# Patient Record
Sex: Female | Born: 1980 | Race: White | Hispanic: No | Smoking: Never smoker
Health system: Southern US, Community
[De-identification: ages and names within clinical notes are randomized; demographics above are authoritative.]

## PROBLEM LIST (undated history)

## (undated) DIAGNOSIS — R002 Palpitations: Secondary | ICD-10-CM

## (undated) DIAGNOSIS — R51 Headache: Secondary | ICD-10-CM

## (undated) DIAGNOSIS — I10 Essential (primary) hypertension: Secondary | ICD-10-CM

## (undated) DIAGNOSIS — F329 Major depressive disorder, single episode, unspecified: Secondary | ICD-10-CM

## (undated) DIAGNOSIS — K219 Gastro-esophageal reflux disease without esophagitis: Secondary | ICD-10-CM

## (undated) DIAGNOSIS — F32A Depression, unspecified: Secondary | ICD-10-CM

## (undated) DIAGNOSIS — F419 Anxiety disorder, unspecified: Secondary | ICD-10-CM

## (undated) HISTORY — PX: TUBAL LIGATION: SHX77

## (undated) HISTORY — PX: EYE SURGERY: SHX253

---

## 2012-12-02 ENCOUNTER — Other Ambulatory Visit: Payer: Self-pay | Admitting: Neurosurgery

## 2012-12-02 ENCOUNTER — Other Ambulatory Visit (HOSPITAL_COMMUNITY): Payer: Self-pay | Admitting: *Deleted

## 2012-12-02 ENCOUNTER — Encounter (HOSPITAL_COMMUNITY): Payer: Self-pay | Admitting: Pharmacy Technician

## 2012-12-02 ENCOUNTER — Encounter (HOSPITAL_COMMUNITY): Payer: Self-pay | Admitting: *Deleted

## 2012-12-03 ENCOUNTER — Encounter (HOSPITAL_COMMUNITY)
Admission: RE | Disposition: A | Discharge status: Home / Self Care | Payer: Self-pay | Source: Ambulatory Visit | Attending: Neurosurgery

## 2012-12-03 ENCOUNTER — Encounter (HOSPITAL_COMMUNITY): Payer: Medicaid Other | Admitting: Certified Registered"

## 2012-12-03 ENCOUNTER — Encounter (HOSPITAL_COMMUNITY): Payer: Self-pay | Admitting: *Deleted

## 2012-12-03 ENCOUNTER — Ambulatory Visit (HOSPITAL_COMMUNITY): Payer: Medicaid Other | Admitting: Certified Registered"

## 2012-12-03 ENCOUNTER — Ambulatory Visit (HOSPITAL_COMMUNITY): Payer: Medicaid Other

## 2012-12-03 ENCOUNTER — Inpatient Hospital Stay (HOSPITAL_COMMUNITY): Payer: Medicaid Other

## 2012-12-03 ENCOUNTER — Inpatient Hospital Stay (HOSPITAL_COMMUNITY)
Admission: RE | Admit: 2012-12-03 | Discharge: 2012-12-04 | DRG: 473 | Disposition: A | Discharge status: Home / Self Care | Payer: Medicaid Other | Source: Ambulatory Visit | Attending: Neurosurgery | Admitting: Neurosurgery

## 2012-12-03 DIAGNOSIS — F329 Major depressive disorder, single episode, unspecified: Secondary | ICD-10-CM | POA: Diagnosis present

## 2012-12-03 DIAGNOSIS — F411 Generalized anxiety disorder: Secondary | ICD-10-CM | POA: Diagnosis present

## 2012-12-03 DIAGNOSIS — Z79899 Other long term (current) drug therapy: Secondary | ICD-10-CM

## 2012-12-03 DIAGNOSIS — K219 Gastro-esophageal reflux disease without esophagitis: Secondary | ICD-10-CM | POA: Diagnosis present

## 2012-12-03 DIAGNOSIS — M5020 Other cervical disc displacement, unspecified cervical region: Principal | ICD-10-CM | POA: Diagnosis present

## 2012-12-03 DIAGNOSIS — F3289 Other specified depressive episodes: Secondary | ICD-10-CM | POA: Diagnosis present

## 2012-12-03 DIAGNOSIS — I10 Essential (primary) hypertension: Secondary | ICD-10-CM | POA: Diagnosis present

## 2012-12-03 HISTORY — DX: Depression, unspecified: F32.A

## 2012-12-03 HISTORY — DX: Gastro-esophageal reflux disease without esophagitis: K21.9

## 2012-12-03 HISTORY — DX: Anxiety disorder, unspecified: F41.9

## 2012-12-03 HISTORY — DX: Palpitations: R00.2

## 2012-12-03 HISTORY — DX: Headache: R51

## 2012-12-03 HISTORY — DX: Essential (primary) hypertension: I10

## 2012-12-03 HISTORY — DX: Major depressive disorder, single episode, unspecified: F32.9

## 2012-12-03 HISTORY — PX: ANTERIOR CERVICAL DECOMP/DISCECTOMY FUSION: SHX1161

## 2012-12-03 LAB — BASIC METABOLIC PANEL
BUN: 11 mg/dL (ref 6–23)
Creatinine, Ser: 0.75 mg/dL (ref 0.50–1.10)
GFR calc Af Amer: >90 mL/min (ref >90)
GFR calc non Af Amer: >90 mL/min (ref >90)
Potassium: 4.3 mEq/L (ref 3.5–5.1)
Sodium: 135 mEq/L (ref 135–145)

## 2012-12-03 LAB — CBC
Hemoglobin: 10.9 g/dL — ABNORMAL LOW (ref 12.0–15.0)
MCHC: 32.5 g/dL (ref 30.0–36.0)
RBC: 3.74 MIL/uL — ABNORMAL LOW (ref 3.87–5.11)
RDW: 14.8 % (ref 11.5–15.5)

## 2012-12-03 LAB — SURGICAL PCR SCREEN
MRSA, PCR: NEGATIVE
Staphylococcus aureus: POSITIVE — AB

## 2012-12-03 LAB — HCG, SERUM, QUALITATIVE: Preg, Serum: NEGATIVE

## 2012-12-03 SURGERY — ANTERIOR CERVICAL DECOMPRESSION/DISCECTOMY FUSION 1 LEVEL
Anesthesia: General | Site: Spine Cervical | Wound class: Clean

## 2012-12-03 MED ORDER — MIDAZOLAM HCL 5 MG/5ML IJ SOLN
INTRAMUSCULAR | Status: DC | PRN
  Administered 2012-12-03: 2 mg via INTRAVENOUS

## 2012-12-03 MED ORDER — ACETAMINOPHEN 325 MG PO TABS: 650.0000 mg | ORAL_TABLET | ORAL | Status: DC | PRN

## 2012-12-03 MED ORDER — SODIUM CHLORIDE 0.9 % IJ SOLN
3.0000 mL | Freq: Two times a day (BID) | INTRAMUSCULAR | Status: DC
  Administered 2012-12-03: 3 mL via INTRAVENOUS

## 2012-12-03 MED ORDER — NEOSTIGMINE METHYLSULFATE 1 MG/ML IJ SOLN
INTRAMUSCULAR | Status: DC | PRN
  Administered 2012-12-03: 4 mg via INTRAVENOUS

## 2012-12-03 MED ORDER — CEFAZOLIN SODIUM-DEXTROSE 2-3 GM-% IV SOLR
INTRAVENOUS | Status: AC
  Administered 2012-12-03: 2 g via INTRAVENOUS
  Filled 2012-12-03: qty 50

## 2012-12-03 MED ORDER — SODIUM CHLORIDE 0.9 % IV SOLN
INTRAVENOUS | Status: DC
  Administered 2012-12-03: 17:00:00 via INTRAVENOUS

## 2012-12-03 MED ORDER — ESCITALOPRAM OXALATE 20 MG PO TABS
20.0000 mg | ORAL_TABLET | Freq: Every day | ORAL | Status: DC
  Filled 2012-12-03: qty 1

## 2012-12-03 MED ORDER — DEXAMETHASONE SODIUM PHOSPHATE 4 MG/ML IJ SOLN
4.0000 mg | Freq: Four times a day (QID) | INTRAMUSCULAR | Status: DC
  Administered 2012-12-03: 4 mg via INTRAVENOUS
  Filled 2012-12-03 (×5): qty 1

## 2012-12-03 MED ORDER — MUPIROCIN 2 % EX OINT
TOPICAL_OINTMENT | Freq: Two times a day (BID) | CUTANEOUS | Status: DC
  Administered 2012-12-03: 22:00:00 via NASAL

## 2012-12-03 MED ORDER — THROMBIN 5000 UNITS EX SOLR
CUTANEOUS | Status: DC | PRN
  Administered 2012-12-03 (×2): 5000 [IU] via TOPICAL

## 2012-12-03 MED ORDER — MUPIROCIN 2 % EX OINT
TOPICAL_OINTMENT | Freq: Two times a day (BID) | CUTANEOUS | Status: DC
  Administered 2012-12-03: 1 via NASAL

## 2012-12-03 MED ORDER — ROCURONIUM BROMIDE 100 MG/10ML IV SOLN
INTRAVENOUS | Status: DC | PRN
  Administered 2012-12-03: 50 mg via INTRAVENOUS

## 2012-12-03 MED ORDER — SODIUM CHLORIDE 0.9 % IJ SOLN: 3.0000 mL | INTRAMUSCULAR | Status: DC | PRN

## 2012-12-03 MED ORDER — PHENYLEPHRINE HCL 10 MG/ML IJ SOLN
INTRAMUSCULAR | Status: DC | PRN
  Administered 2012-12-03: 120 ug via INTRAVENOUS
  Administered 2012-12-03: 80 ug via INTRAVENOUS
  Administered 2012-12-03: 120 ug via INTRAVENOUS
  Administered 2012-12-03: 80 ug via INTRAVENOUS

## 2012-12-03 MED ORDER — PROPOFOL 10 MG/ML IV BOLUS
INTRAVENOUS | Status: DC | PRN
  Administered 2012-12-03: 120 mg via INTRAVENOUS

## 2012-12-03 MED ORDER — 0.9 % SODIUM CHLORIDE (POUR BTL) OPTIME
TOPICAL | Status: DC | PRN
  Administered 2012-12-03: 1000 mL

## 2012-12-03 MED ORDER — ONDANSETRON HCL 4 MG/2ML IJ SOLN: 4.0000 mg | INTRAMUSCULAR | Status: DC | PRN

## 2012-12-03 MED ORDER — GLYCOPYRROLATE 0.2 MG/ML IJ SOLN
INTRAMUSCULAR | Status: DC | PRN
  Administered 2012-12-03: .6 mg via INTRAVENOUS

## 2012-12-03 MED ORDER — OXYCODONE-ACETAMINOPHEN 5-325 MG PO TABS
1.0000 | ORAL_TABLET | ORAL | Status: DC | PRN
  Administered 2012-12-03 – 2012-12-04 (×3): 2 via ORAL
  Filled 2012-12-03 (×3): qty 2

## 2012-12-03 MED ORDER — DEXAMETHASONE SODIUM PHOSPHATE 10 MG/ML IJ SOLN
INTRAMUSCULAR | Status: AC
  Administered 2012-12-03: 10 mg via INTRAVENOUS
  Filled 2012-12-03: qty 1

## 2012-12-03 MED ORDER — IOHEXOL 300 MG/ML  SOLN
80.0000 mL | Freq: Once | INTRAMUSCULAR | Status: AC | PRN
  Administered 2012-12-03: 75 mL via INTRAVENOUS

## 2012-12-03 MED ORDER — MORPHINE SULFATE 2 MG/ML IJ SOLN: 1.0000 mg | INTRAMUSCULAR | Status: DC | PRN

## 2012-12-03 MED ORDER — DIAZEPAM 5 MG PO TABS
5.0000 mg | ORAL_TABLET | Freq: Four times a day (QID) | ORAL | Status: DC | PRN
  Administered 2012-12-03 – 2012-12-04 (×2): 5 mg via ORAL
  Filled 2012-12-03 (×2): qty 1

## 2012-12-03 MED ORDER — MUPIROCIN 2 % EX OINT
TOPICAL_OINTMENT | CUTANEOUS | Status: AC
  Filled 2012-12-03: qty 22

## 2012-12-03 MED ORDER — METOPROLOL SUCCINATE ER 25 MG PO TB24
25.0000 mg | ORAL_TABLET | Freq: Every day | ORAL | Status: DC
  Filled 2012-12-03: qty 1

## 2012-12-03 MED ORDER — MENTHOL 3 MG MT LOZG: 1.0000 | LOZENGE | OROMUCOSAL | Status: DC | PRN

## 2012-12-03 MED ORDER — HYDROMORPHONE HCL PF 1 MG/ML IJ SOLN
INTRAMUSCULAR | Status: AC
  Administered 2012-12-03: 0.5 mg via INTRAVENOUS
  Filled 2012-12-03: qty 1

## 2012-12-03 MED ORDER — HEMOSTATIC AGENTS (NO CHARGE) OPTIME
TOPICAL | Status: DC | PRN
  Administered 2012-12-03: 1 via TOPICAL

## 2012-12-03 MED ORDER — ACETAMINOPHEN 650 MG RE SUPP: 650.0000 mg | RECTAL | Status: DC | PRN

## 2012-12-03 MED ORDER — PHENOL 1.4 % MT LIQD: 1.0000 | OROMUCOSAL | Status: DC | PRN

## 2012-12-03 MED ORDER — FENTANYL CITRATE 0.05 MG/ML IJ SOLN
INTRAMUSCULAR | Status: DC | PRN
  Administered 2012-12-03: 50 ug via INTRAVENOUS
  Administered 2012-12-03 (×2): 100 ug via INTRAVENOUS

## 2012-12-03 MED ORDER — HYDROMORPHONE HCL PF 1 MG/ML IJ SOLN
0.2500 mg | INTRAMUSCULAR | Status: DC | PRN
  Administered 2012-12-03 (×2): 0.5 mg via INTRAVENOUS

## 2012-12-03 MED ORDER — LACTATED RINGERS IV SOLN
INTRAVENOUS | Status: DC
  Administered 2012-12-03: 11:00:00 via INTRAVENOUS

## 2012-12-03 MED ORDER — PHENYLEPHRINE HCL 10 MG/ML IJ SOLN
10.0000 mg | INTRAVENOUS | Status: DC | PRN
  Administered 2012-12-03: 50 ug/min via INTRAVENOUS

## 2012-12-03 MED ORDER — ONDANSETRON HCL 4 MG/2ML IJ SOLN
INTRAMUSCULAR | Status: DC | PRN
  Administered 2012-12-03: 4 mg via INTRAVENOUS

## 2012-12-03 MED ORDER — OXYCODONE HCL 5 MG/5ML PO SOLN: 5.0000 mg | Freq: Once | ORAL | Status: DC | PRN

## 2012-12-03 MED ORDER — VANCOMYCIN HCL 10 G IV SOLR
1500.0000 mg | Freq: Two times a day (BID) | INTRAVENOUS | Status: AC
  Administered 2012-12-03 – 2012-12-04 (×2): 1500 mg via INTRAVENOUS
  Filled 2012-12-03 (×2): qty 1500

## 2012-12-03 MED ORDER — OXYCODONE HCL 5 MG PO TABS: 5.0000 mg | ORAL_TABLET | Freq: Once | ORAL | Status: DC | PRN

## 2012-12-03 MED ORDER — LIDOCAINE HCL (CARDIAC) 20 MG/ML IV SOLN
INTRAVENOUS | Status: DC | PRN
  Administered 2012-12-03: 40 mg via INTRAVENOUS

## 2012-12-03 MED ORDER — DEXAMETHASONE 4 MG PO TABS
4.0000 mg | ORAL_TABLET | Freq: Four times a day (QID) | ORAL | Status: DC
  Administered 2012-12-03 – 2012-12-04 (×2): 4 mg via ORAL
  Filled 2012-12-03 (×6): qty 1

## 2012-12-03 SURGICAL SUPPLY — 55 items
BANDAGE GAUZE ELAST BULKY 4 IN (GAUZE/BANDAGES/DRESSINGS) ×4 IMPLANT
BENZOIN TINCTURE PRP APPL 2/3 (GAUZE/BANDAGES/DRESSINGS) ×2 IMPLANT
BIT DRILL SM SPINE QC 12 (BIT) ×2 IMPLANT
BLADE ULTRA TIP 2M (BLADE) ×2 IMPLANT
BUR BARREL STRAIGHT FLUTE 4.0 (BURR) ×2 IMPLANT
BUR MATCHSTICK NEURO 3.0 LAGG (BURR) ×2 IMPLANT
CANISTER SUCT 3000ML (MISCELLANEOUS) ×2 IMPLANT
CONT SPEC 4OZ CLIKSEAL STRL BL (MISCELLANEOUS) ×2 IMPLANT
COVER MAYO STAND STRL (DRAPES) ×2 IMPLANT
DRAPE C-ARM 42X72 X-RAY (DRAPES) IMPLANT
DRAPE LAPAROTOMY 100X72 PEDS (DRAPES) ×2 IMPLANT
DRAPE MICROSCOPE LEICA (MISCELLANEOUS) ×2 IMPLANT
DRAPE POUCH INSTRU U-SHP 10X18 (DRAPES) ×2 IMPLANT
DURAPREP 6ML APPLICATOR 50/CS (WOUND CARE) ×2 IMPLANT
ELECT REM PT RETURN 9FT ADLT (ELECTROSURGICAL) ×2
ELECTRODE REM PT RTRN 9FT ADLT (ELECTROSURGICAL) ×1 IMPLANT
GAUZE SPONGE 4X4 16PLY XRAY LF (GAUZE/BANDAGES/DRESSINGS) IMPLANT
GLOVE BIO SURGEON STRL SZ8 (GLOVE) ×2 IMPLANT
GLOVE BIOGEL M 8.0 STRL (GLOVE) ×2 IMPLANT
GLOVE BIOGEL PI IND STRL 7.5 (GLOVE) ×1 IMPLANT
GLOVE BIOGEL PI IND STRL 8 (GLOVE) ×2 IMPLANT
GLOVE BIOGEL PI INDICATOR 7.5 (GLOVE) ×1
GLOVE BIOGEL PI INDICATOR 8 (GLOVE) ×2
GLOVE ECLIPSE 7.5 STRL STRAW (GLOVE) ×10 IMPLANT
GLOVE EXAM NITRILE LRG STRL (GLOVE) IMPLANT
GLOVE EXAM NITRILE MD LF STRL (GLOVE) IMPLANT
GLOVE EXAM NITRILE XL STR (GLOVE) IMPLANT
GLOVE EXAM NITRILE XS STR PU (GLOVE) IMPLANT
GLOVE INDICATOR 8.5 STRL (GLOVE) ×2 IMPLANT
GOWN BRE IMP SLV AUR LG STRL (GOWN DISPOSABLE) ×2 IMPLANT
GOWN BRE IMP SLV AUR XL STRL (GOWN DISPOSABLE) ×4 IMPLANT
GOWN STRL REIN 2XL LVL4 (GOWN DISPOSABLE) ×2 IMPLANT
HEMOSTAT POWDER KIT SURGIFOAM (HEMOSTASIS) IMPLANT
KIT BASIN OR (CUSTOM PROCEDURE TRAY) ×2 IMPLANT
KIT ROOM TURNOVER OR (KITS) ×2 IMPLANT
NEEDLE SPNL 22GX3.5 QUINCKE BK (NEEDLE) ×4 IMPLANT
NS IRRIG 1000ML POUR BTL (IV SOLUTION) ×2 IMPLANT
PACK LAMINECTOMY NEURO (CUSTOM PROCEDURE TRAY) ×2 IMPLANT
PATTIES SURGICAL .5 X1 (DISPOSABLE) ×2 IMPLANT
PLATE ANT CERV XTEND 1 LV 14 (Plate) ×2 IMPLANT
PUTTY DBX 1CC (Putty) ×2 IMPLANT
PUTTY DBX 1CC DEPUY (Putty) ×1 IMPLANT
RUBBERBAND STERILE (MISCELLANEOUS) ×4 IMPLANT
SCREW XTD VAR 4.2 SELF TAP 12 (Screw) ×8 IMPLANT
SPACER COLONIAL SMALL 8MM (Spacer) ×2 IMPLANT
SPONGE GAUZE 4X4 12PLY (GAUZE/BANDAGES/DRESSINGS) ×2 IMPLANT
SPONGE INTESTINAL PEANUT (DISPOSABLE) ×2 IMPLANT
SPONGE SURGIFOAM ABS GEL SZ50 (HEMOSTASIS) ×2 IMPLANT
STRIP CLOSURE SKIN 1/2X4 (GAUZE/BANDAGES/DRESSINGS) ×2 IMPLANT
SUT VIC AB 3-0 SH 8-18 (SUTURE) ×2 IMPLANT
SYR 20ML ECCENTRIC (SYRINGE) ×2 IMPLANT
TAPE CLOTH SURG 4X10 WHT LF (GAUZE/BANDAGES/DRESSINGS) ×2 IMPLANT
TOWEL OR 17X24 6PK STRL BLUE (TOWEL DISPOSABLE) ×2 IMPLANT
TOWEL OR 17X26 10 PK STRL BLUE (TOWEL DISPOSABLE) ×2 IMPLANT
WATER STERILE IRR 1000ML POUR (IV SOLUTION) ×2 IMPLANT

## 2012-12-03 NOTE — Plan of Care (Signed)
Problem: Consults Goal: Diagnosis - Spinal Surgery Outcome: Completed/Met Date Met:  12/03/12 Cervical Spine Fusion

## 2012-12-03 NOTE — Transfer of Care (Signed)
Immediate Anesthesia Transfer of Care Note  Patient: Raven Brown  Procedure(s) Performed: Procedure(s) with comments: ANTERIOR CERVICAL DECOMPRESSION/DISCECTOMY FUSION 1 LEVEL (N/A) - C6-7 Anterior cervical decompression/diskectomy/fusion  Patient Location: PACU  Anesthesia Type:General  Level of Consciousness: awake, alert , oriented and patient cooperative  Airway & Oxygen Therapy: Patient Spontanous Breathing and Patient connected to nasal cannula oxygen  Post-op Assessment: Report given to PACU RN, Post -op Vital signs reviewed and stable and Patient moving all extremities  Post vital signs: Reviewed and stable  Complications: No apparent anesthesia complications

## 2012-12-03 NOTE — Preoperative (Addendum)
Beta Blockers   Reason not to administer Beta Blockers:Not Applicable, last dose 12/03/2012

## 2012-12-03 NOTE — Progress Notes (Signed)
Op note [phone]

## 2012-12-03 NOTE — Progress Notes (Signed)
ANTIBIOTIC CONSULT NOTE - INITIAL  Pharmacy Consult:  Vancomycin Indication:  Surgical prophylaxis  Allergies  Allergen Reactions  . Avelox [Moxifloxacin Hcl In Nacl] Anaphylaxis    Patient Measurements: Height: 5\' 6"  (167.6 cm) Weight: 182 lb 15.7 oz (83 kg) IBW/kg (Calculated) : 59.3 Vital Signs: Temp: 97.4 F (36.3 C) (10/29 1634) Temp src: Oral (10/29 0853) BP: 115/67 mmHg (10/29 1634) Pulse Rate: 102 (10/29 1644) Intake/Output from this shift: Total I/O In: 1040 [P.O.:240; I.V.:800] Out: -   Labs:  Recent Labs  12/03/12 0955  WBC 8.7  HGB 10.9*  PLT 323  CREATININE 0.75   Estimated Creatinine Clearance: 108.6 ml/min (by C-G formula based on Cr of 0.75). No results found for this basename: VANCOTROUGH, Leodis Binet, VANCORANDOM, GENTTROUGH, GENTPEAK, GENTRANDOM, TOBRATROUGH, TOBRAPEAK, TOBRARND, AMIKACINPEAK, AMIKACINTROU, AMIKACIN,  in the last 72 hours   Microbiology: Recent Results (from the past 720 hour(s))  SURGICAL PCR SCREEN     Status: Abnormal   Collection Time    12/03/12  9:54 AM      Result Value Range Status   MRSA, PCR NEGATIVE  NEGATIVE Final   Staphylococcus aureus POSITIVE (*) NEGATIVE Final   Comment:            The Xpert SA Assay (FDA     approved for NASAL specimens     in patients over 65 years of age),     is one component of     a comprehensive surveillance     program.  Test performance has     been validated by [employer] for patients greater     than or equal to 86 year old.     It is not intended     to diagnose infection nor to     guide or monitor treatment.    Medical History: Past Medical History  Diagnosis Date  . Hypertension     treated with Metoprolol  . Headache(784.0)     migraines  . Palpitations     due to Amitriptylline  . Anxiety   . Depression   . GERD (gastroesophageal reflux disease)     will take otc Prilosec occ.       Assessment: 32 YOF s/p cervical decompression/disketomy fusion to  start vancomycin for surgical prophylaxis.  Patient received Ancef pre-op but with MRSA screen being positive, vancomycin to be used post-op x 24 hours per on-call MD instruction.  Patient with good renal clearance.   Goal of Therapy:  Infection prevention   Plan:  - Vanc 1500mg  IV Q12H x 2 doses - Pharmacy will sign off as dosage adjustment unnecessary.  Thank you for the consult!    Saya Mccoll D. Laney Potash, PharmD, BCPS Pager:  [phone] 12/03/2012, 5:31 PM

## 2012-12-03 NOTE — Anesthesia Procedure Notes (Addendum)
Procedure Name: Intubation Date/Time: 12/03/2012 12:47 PM Performed by: Sharlene Dory E Pre-anesthesia Checklist: Patient identified, Emergency Drugs available, Timeout performed, Suction available and Patient being monitored Patient Re-evaluated:Patient Re-evaluated prior to inductionOxygen Delivery Method: Circle system utilized Preoxygenation: Pre-oxygenation with 100% oxygen Intubation Type: IV induction Ventilation: Mask ventilation without difficulty and Oral airway inserted - appropriate to patient size Laryngoscope Size: Mac and 3 Grade View: Grade I Tube type: Oral Tube size: 7.0 mm Number of attempts: 1 Airway Equipment and Method: Stylet and Oral airway Placement Confirmation: ETT inserted through vocal cords under direct vision,  CO2 detector,  positive ETCO2 and breath sounds checked- equal and bilateral Secured at: 23 cm Tube secured with: Tape Dental Injury: Teeth and Oropharynx as per pre-operative assessment  Comments: AOI per Guadlupe Spanish, SRNA with MDA supervision.

## 2012-12-03 NOTE — Anesthesia Preprocedure Evaluation (Addendum)
Anesthesia Evaluation  Patient identified by MRN, date of birth, ID band Patient awake    Reviewed: Allergy & Precautions, H&P , NPO status , Patient's Chart, lab work & pertinent test results, reviewed documented beta blocker date and time   Airway Mallampati: II TM Distance: >3 FB Neck ROM: Full    Dental  (+) Teeth Intact and Dental Advisory Given   Pulmonary neg pulmonary ROS,  breath sounds clear to auscultation        Cardiovascular hypertension, Pt. on medications and Pt. on home beta blockers Rhythm:Regular Rate:Normal     Neuro/Psych Anxiety Depression negative neurological ROS     GI/Hepatic Neg liver ROS, GERD-  Controlled,  Endo/Other  negative endocrine ROS  Renal/GU negative Renal ROS  negative genitourinary   Musculoskeletal negative musculoskeletal ROS (+)   Abdominal   Peds negative pediatric ROS (+)  Hematology negative hematology ROS (+)   Anesthesia Other Findings   Reproductive/Obstetrics negative OB ROS                          Anesthesia Physical Anesthesia Plan  ASA: II  Anesthesia Plan: General   Post-op Pain Management:    Induction: Intravenous  Airway Management Planned: Oral ETT  Additional Equipment:   Intra-op Plan:   Post-operative Plan: Extubation in OR  Informed Consent: I have reviewed the patients History and Physical, chart, labs and discussed the procedure including the risks, benefits and alternatives for the proposed anesthesia with the patient or authorized representative who has indicated his/her understanding and acceptance.   Dental advisory given  Plan Discussed with: CRNA  Anesthesia Plan Comments:         Anesthesia Quick Evaluation

## 2012-12-03 NOTE — Anesthesia Postprocedure Evaluation (Signed)
  Anesthesia Post-op Note  Patient: Raven Brown  Procedure(s) Performed: Procedure(s): CERVICAL SIX-SEVEN ANTERIOR CERVICAL DECOMPRESSION/DISKECTOMY FUSION (N/A)  Patient Location: PACU  Anesthesia Type:General  Level of Consciousness: awake and alert   Airway and Oxygen Therapy: Patient Spontanous Breathing  Post-op Pain: mild  Post-op Assessment: Post-op Vital signs reviewed, Patient's Cardiovascular Status Stable and Respiratory Function Stable  Post-op Vital Signs: Reviewed  Filed Vitals:   12/03/12 1400  BP:   Pulse:   Temp: 36.6 C  Resp:     Complications: No apparent anesthesia complications

## 2012-12-03 NOTE — Progress Notes (Signed)
Orthopedic Tech Progress Note Patient Details:  Raven Brown 1980/02/24 [id number]  Ortho Devices Type of Ortho Device: Soft collar Ortho Device/Splint Location: neck Ortho Device/Splint Interventions: Ordered;Application   Jennye Moccasin 12/03/2012, 4:46 PM

## 2012-12-03 NOTE — H&P (Signed)
Raven Brown is an 32 y.o. female.   Chief Complaint: right armpainHPI: seen in my office 2 days ago with sudden onset of neck pain with radiation to thre right arm, no better with conservative treatment. Mri shows a large hnp at cervical 6-7  Past Medical History  Diagnosis Date  . Hypertension     treated with Metoprolol  . Headache(784.0)     migraines  . Palpitations     due to Amitriptylline  . Anxiety   . Depression   . GERD (gastroesophageal reflux disease)     will take otc Prilosec occ.    Past Surgical History  Procedure Laterality Date  . Cesarean section      x2  . Tubal ligation    . Eye surgery      lasik on both eyes    Family History  Problem Relation Age of Onset  . Supraventricular tachycardia Mother   . Hypertension Father   . Diabetes Father    Social History:  reports that she has never smoked. She has never used smokeless tobacco. She reports that she drinks alcohol. She reports that she does not use illicit drugs.  Allergies:  Allergies  Allergen Reactions  . Avelox [Moxifloxacin Hcl In Nacl] Anaphylaxis    Medications Prior to Admission  Medication Sig Dispense Refill  . diazepam (VALIUM) 5 MG tablet Take 5 mg by mouth every 6 (six) hours as needed (muscle spasms).      Marland Kitchen escitalopram (LEXAPRO) 20 MG tablet Take 20 mg by mouth daily.      . metoprolol succinate (TOPROL-XL) 25 MG 24 hr tablet Take 25 mg by mouth daily.      Marland Kitchen oxyCODONE (ROXICODONE) 15 MG immediate release tablet Take 15 mg by mouth every 4 (four) hours as needed for pain.        Results for orders placed during the hospital encounter of 12/03/12 (from the past 48 hour(s))  SURGICAL PCR SCREEN     Status: Abnormal   Collection Time    12/03/12  9:54 AM      Result Value Range   MRSA, PCR NEGATIVE  NEGATIVE   Staphylococcus aureus POSITIVE (*) NEGATIVE   Comment:            The Xpert SA Assay (FDA     approved for NASAL specimens     in patients over 15 years of age),      is one component of     a comprehensive surveillance     program.  Test performance has     been validated by [employer] for patients greater     than or equal to 7 year old.     It is not intended     to diagnose infection nor to     guide or monitor treatment.  BASIC METABOLIC PANEL     Status: Abnormal   Collection Time    12/03/12  9:55 AM      Result Value Range   Sodium 135  135 - 145 mEq/L   Potassium 4.3  3.5 - 5.1 mEq/L   Chloride 98  96 - 112 mEq/L   CO2 28  19 - 32 mEq/L   Glucose, Bld 104 (*) 70 - 99 mg/dL   BUN 11  6 - 23 mg/dL   Creatinine, Ser 6.96  0.50 - 1.10 mg/dL   Calcium 9.6  8.4 - 29.5 mg/dL   GFR calc non Af  Amer >90  >90 mL/min   GFR calc Af Amer >90  >90 mL/min   Comment: (NOTE)     The eGFR has been calculated using the CKD EPI equation.     This calculation has not been validated in all clinical situations.     eGFR's persistently <90 mL/min signify possible Chronic Kidney     Disease.  CBC     Status: Abnormal   Collection Time    12/03/12  9:55 AM      Result Value Range   WBC 8.7  4.0 - 10.5 K/uL   RBC 3.74 (*) 3.87 - 5.11 MIL/uL   Hemoglobin 10.9 (*) 12.0 - 15.0 g/dL   HCT 16.1 (*) 09.6 - 04.5 %   MCV 89.6  78.0 - 100.0 fL   MCH 29.1  26.0 - 34.0 pg   MCHC 32.5  30.0 - 36.0 g/dL   RDW 40.9  81.1 - 91.4 %   Platelets 323  150 - 400 K/uL  HCG, SERUM, QUALITATIVE     Status: None   Collection Time    12/03/12  9:55 AM      Result Value Range   Preg, Serum NEGATIVE  NEGATIVE   Comment:            THE SENSITIVITY OF THIS     METHODOLOGY IS >10 mIU/mL.   Dg Chest 2 View  12/03/2012   CLINICAL DATA:  Hypertension, preoperative evaluation  EXAM: CHEST  2 VIEW  COMPARISON:  11/15/2012 right shoulder series  FINDINGS: Normal heart size and vascularity. Faint ill-defined round opacity in the right upper lung, roughly measures 2.5 x 2.0 cm, and projects over the 3rd anterior rib and the 6th posterior rib shadows. Focal airspace  process/pneumonia versus underlying nodule not excluded. Minor central bronchitic change and left basilar streaky opacity compatible with atelectasis. No effusion or pneumothorax. Trachea is midline. No osseous abnormality.  IMPRESSION: Right upper lung 2.5 cm round focal airspace process/pneumonia versus lung nodule. Consider short-term followup in 1 month versus nonemergent chest CT.  Mild central bronchitic change and left base atelectasis  These results will be called to the ordering clinician or representative by the Radiologist Assistant, and communication documented in the PACS Dashboard.   Electronically Signed   By: Ruel Favors M.D.   On: 12/03/2012 10:16    Review of Systems  Constitutional: Negative.   Eyes: Negative.   Respiratory: Negative.   Cardiovascular: Negative.   Gastrointestinal: Negative.   Genitourinary: Negative.   Musculoskeletal: Positive for neck pain.  Skin: Negative.   Neurological: Positive for sensory change and focal weakness.  Endo/Heme/Allergies: Negative.   Psychiatric/Behavioral: Positive for depression. The patient is nervous/anxious.     Blood pressure 128/76, pulse 104, temperature 98.8 F (37.1 C), temperature source Oral, resp. rate 20, height 5\' 6"  (1.676 m), weight 83 kg (182 lb 15.7 oz), last menstrual period 11/24/2012, SpO2 96.00%. Physical Exam crying because pain. Hent, nl. Neck, no movement to prevent pain. Cv, nl. lungds clear/. Abdomen, nl. Extremities, nl. Neuro weakness of right triceps. Sensory changes along c7 dermatome.  Assessment/Plan Decompression and fusion at cervical 6-7. Patient and mother are aware of risks and benefits.  Satin Boal M 12/03/2012, 11:46 AM

## 2012-12-04 NOTE — Discharge Summary (Signed)
Physician Discharge Summary  Patient ID: Sue Fernicola MRN: [id number] DOB/AGE: [birth year] 32 y.o.  Admit date: 12/03/2012 Discharge date: 12/04/2012  Admission Diagnoses:c6-7 hnp  Discharge Diagnoses:same  Active Problems:   * No active hospital problems. *   Discharged Condition: no pain  Hospital Course: surgery  Consults: none  Significant Diagnostic Studies:mri cervical. Ct chest  Treatments: fusion at cervical 6-7 Discharge Exam: Blood pressure 122/78, pulse 97, temperature 98.8 F (37.1 C), temperature source Oral, resp. rate 16, height 5\' 6"  (1.676 m), weight 83 kg (182 lb 15.7 oz), last menstrual period 11/24/2012, SpO2 95.00%. No pain, no weakness  Disposition:home    Medication List    ASK your doctor about these medications       diazepam 5 MG tablet  Commonly known as:  VALIUM  Take 5 mg by mouth every 6 (six) hours as needed (muscle spasms).     escitalopram 20 MG tablet  Commonly known as:  LEXAPRO  Take 20 mg by mouth daily.     metoprolol succinate 25 MG 24 hr tablet  Commonly known as:  TOPROL-XL  Take 25 mg by mouth daily.     oxyCODONE 15 MG immediate release tablet  Commonly known as:  ROXICODONE  Take 15 mg by mouth every 4 (four) hours as needed for pain.         Signed: Karn Cassis 12/04/2012, 9:32 AM

## 2012-12-04 NOTE — Op Note (Signed)
Raven Brown, HAUGHTON NO.:  [id number]  MEDICAL RECORD NO.:  [id number]  LOCATION:  3C02C                        FACILITY:  [facility]  PHYSICIAN:  Hilda Lias, M.D.   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  POSTOPERATIVE DIAGNOSIS:  Acute right C6-7 herniated disk with right side radiculopathy.  PROCEDURE:  Anterior C6-C7 diskectomy, decompression of the spinal cord, decompression of both C7 nerve roots.  Removal of several fragment of disk in the right side, interbody fusion with a cage 8 mm height, lordotic with autograft and DBX, plate, microscope.  SURGEON:  Hilda Lias, M.D.  ASSISTANT:  Donalee Citrin, M.D.  CLINICAL HISTORY:  The patient was seen by me 2 days ago in my office with pain going to the right upper extremity associated with severe weakness of the right triceps.  X-ray showed large herniated disk at the level C6-7 with displacement of the spinal cord.  She had been taking medication for 2 weeks with no improvement.  Surgery was advised and the risks were fully explained to her and her mother.  PROCEDURE:  The patient was taken to the OR, and after intubation, the left side of the neck was cleaned with DuraPrep.  Transverse incision was made through the skin, subcutaneous tissue, platysma straight down to the cervical spine.  The patient has a short neck, we used 2 needles, the upper needle was at the level of 5-6.  From then on, we localized C6- 7 with the help of the microscope, we opened the anterior ligament. Indeed what we found was the disk space was almost end.  Removal of the endplate was done and we went straight down to the posterior ligament. There was some opening with fragment of disk going from the mid-level of the spinal cord straight to the right side.  At  least 7 fragment were removed and at the end we had good decompression not only of the spinal cord but the C6-C7 space and the nerve root.  Then, we introduced a cage, 8 mm height, lordotic with DBX and autograft inside.  This was followed by a plate using 4 screws.  X-ray showed the upper part of the plate.  From then on, I waited 5 minutes and once we achieved hemostasis, the wound was closed with Vicryl and Steri-Strips.          ______________________________ Hilda Lias, M.D.     EB/MEDQ  D:  12/03/2012  T:  12/04/2012  Job:  [id number]

## 2012-12-04 NOTE — Progress Notes (Signed)
Pt given D/C instructions with verbal understanding. Pt D/C'd home via walking per MD order @ 1005. Rema Fendt, RN

## 2012-12-05 ENCOUNTER — Encounter (HOSPITAL_COMMUNITY): Payer: Self-pay | Admitting: Neurosurgery

## 2012-12-09 ENCOUNTER — Other Ambulatory Visit (HOSPITAL_COMMUNITY): Payer: Self-pay | Admitting: Neurosurgery

## 2012-12-09 DIAGNOSIS — R911 Solitary pulmonary nodule: Secondary | ICD-10-CM

## 2012-12-15 ENCOUNTER — Ambulatory Visit (HOSPITAL_COMMUNITY): Admission: RE | Admit: 2012-12-15 | Payer: Medicaid Other | Source: Ambulatory Visit

## 2013-11-02 ENCOUNTER — Encounter (HOSPITAL_COMMUNITY): Payer: Self-pay | Admitting: Emergency Medicine

## 2013-11-02 ENCOUNTER — Emergency Department (HOSPITAL_COMMUNITY)
Admission: EM | Admit: 2013-11-02 | Discharge: 2013-11-03 | Disposition: A | Discharge status: Home / Self Care | Payer: Medicaid Other | Attending: Emergency Medicine | Admitting: Emergency Medicine

## 2013-11-02 DIAGNOSIS — F32A Depression, unspecified: Secondary | ICD-10-CM

## 2013-11-02 DIAGNOSIS — R079 Chest pain, unspecified: Secondary | ICD-10-CM | POA: Diagnosis not present

## 2013-11-02 DIAGNOSIS — I10 Essential (primary) hypertension: Secondary | ICD-10-CM | POA: Diagnosis not present

## 2013-11-02 DIAGNOSIS — G43909 Migraine, unspecified, not intractable, without status migrainosus: Secondary | ICD-10-CM | POA: Diagnosis not present

## 2013-11-02 DIAGNOSIS — F4321 Adjustment disorder with depressed mood: Secondary | ICD-10-CM | POA: Diagnosis not present

## 2013-11-02 DIAGNOSIS — F3289 Other specified depressive episodes: Secondary | ICD-10-CM | POA: Insufficient documentation

## 2013-11-02 DIAGNOSIS — F329 Major depressive disorder, single episode, unspecified: Secondary | ICD-10-CM | POA: Diagnosis present

## 2013-11-02 DIAGNOSIS — Z79899 Other long term (current) drug therapy: Secondary | ICD-10-CM | POA: Diagnosis not present

## 2013-11-02 DIAGNOSIS — K219 Gastro-esophageal reflux disease without esophagitis: Secondary | ICD-10-CM | POA: Diagnosis not present

## 2013-11-02 DIAGNOSIS — Z3202 Encounter for pregnancy test, result negative: Secondary | ICD-10-CM | POA: Insufficient documentation

## 2013-11-02 DIAGNOSIS — F411 Generalized anxiety disorder: Secondary | ICD-10-CM | POA: Diagnosis not present

## 2013-11-02 LAB — COMPREHENSIVE METABOLIC PANEL
ALBUMIN: 4.3 g/dL (ref 3.5–5.2)
ALT: 11 U/L (ref 0–35)
AST: 16 U/L (ref 0–37)
Alkaline Phosphatase: 80 U/L (ref 39–117)
Anion gap: 11 (ref 5–15)
BUN: 9 mg/dL (ref 6–23)
CALCIUM: 10.1 mg/dL (ref 8.4–10.5)
CO2: 24 mEq/L (ref 19–32)
CREATININE: 0.71 mg/dL (ref 0.50–1.10)
Chloride: 102 mEq/L (ref 96–112)
GFR calc Af Amer: >90 mL/min (ref >90)
GFR calc non Af Amer: >90 mL/min (ref >90)
Glucose, Bld: 98 mg/dL (ref 70–99)
Potassium: 5 mEq/L (ref 3.7–5.3)
SODIUM: 137 meq/L (ref 137–147)
TOTAL PROTEIN: 7.6 g/dL (ref 6.0–8.3)
Total Bilirubin: <0.2 mg/dL — ABNORMAL LOW (ref 0.3–1.2)

## 2013-11-02 LAB — CBC
HCT: 38.8 % (ref 36.0–46.0)
Hemoglobin: 13 g/dL (ref 12.0–15.0)
MCH: 29.6 pg (ref 26.0–34.0)
MCHC: 33.5 g/dL (ref 30.0–36.0)
MCV: 88.4 fL (ref 78.0–100.0)
PLATELETS: 356 10*3/uL (ref 150–400)
RBC: 4.39 MIL/uL (ref 3.87–5.11)
RDW: 12.4 % (ref 11.5–15.5)
WBC: 10.5 10*3/uL (ref 4.0–10.5)

## 2013-11-02 LAB — RAPID URINE DRUG SCREEN, HOSP PERFORMED
AMPHETAMINES: NOT DETECTED
BENZODIAZEPINES: POSITIVE — AB
Barbiturates: NOT DETECTED
Cocaine: NOT DETECTED
OPIATES: NOT DETECTED
Tetrahydrocannabinol: NOT DETECTED

## 2013-11-02 LAB — ACETAMINOPHEN LEVEL: Acetaminophen (Tylenol), Serum: <15 ug/mL (ref 10–30)

## 2013-11-02 LAB — ETHANOL: Alcohol, Ethyl (B): <11 mg/dL (ref 0–11)

## 2013-11-02 LAB — POC URINE PREG, ED: PREG TEST UR: NEGATIVE

## 2013-11-02 LAB — SALICYLATE LEVEL

## 2013-11-02 MED ORDER — ZOLPIDEM TARTRATE 5 MG PO TABS: 5.0000 mg | ORAL_TABLET | Freq: Every evening | ORAL | Status: DC | PRN

## 2013-11-02 MED ORDER — IBUPROFEN 200 MG PO TABS: 600.0000 mg | ORAL_TABLET | Freq: Three times a day (TID) | ORAL | Status: DC | PRN

## 2013-11-02 MED ORDER — ACETAMINOPHEN 325 MG PO TABS: 650.0000 mg | ORAL_TABLET | ORAL | Status: DC | PRN

## 2013-11-02 MED ORDER — ONDANSETRON HCL 4 MG PO TABS: 4.0000 mg | ORAL_TABLET | Freq: Three times a day (TID) | ORAL | Status: DC | PRN

## 2013-11-02 MED ORDER — NICOTINE 21 MG/24HR TD PT24: 21.0000 mg | MEDICATED_PATCH | Freq: Every day | TRANSDERMAL | Status: DC

## 2013-11-02 MED ORDER — ALUM & MAG HYDROXIDE-SIMETH 200-200-20 MG/5ML PO SUSP: 30.0000 mL | ORAL | Status: DC | PRN

## 2013-11-02 MED ORDER — LORAZEPAM 1 MG PO TABS
1.0000 mg | ORAL_TABLET | Freq: Three times a day (TID) | ORAL | Status: DC | PRN
  Administered 2013-11-02: 1 mg via ORAL
  Filled 2013-11-02: qty 1

## 2013-11-02 NOTE — ED Provider Notes (Signed)
CSN: [id number]     Arrival date & time 11/02/13  1932 History   First MD Initiated Contact with Patient 11/02/13 2125     Chief Complaint  Patient presents with  . Depression   The history is provided by the patient and a parent. No language interpreter was used.   This chart was scribed for non-physician practitioner, Junious Silk PA-C working with Mirian Mo, MD, by Andrew Au, ED Scribe. This patient was seen in room WTR4/WLPT4 and the patient's care was started at 10:04 PM.  Raven Brown is a 33 y.o. female who presents to the Emergency Department complaining of worsening depression. Pt states she was at [facility] for 3 night in July. Pt states she has not had consistency with medication for the past 2 months. Per family member, pt has a lot of anger. Pt reports it has come to a point where she has a lot of anger built up and is reaching out for help. Pt states she has CP from anxiety. Pt reports hx of though of hurting herself. She denies specific suicidal ideation, but states she has a bottle of Xanax and wouldn't think twice about taking the whole thing at once to "go to sleep". Pt denies hallucination. Pt denies alcohol and drugs.  Pt denies nausea and emesis.   Past Medical History  Diagnosis Date  . Hypertension     treated with Metoprolol  . Headache(784.0)     migraines  . Palpitations     due to Amitriptylline  . Anxiety   . Depression   . GERD (gastroesophageal reflux disease)     will take otc Prilosec occ.   Past Surgical History  Procedure Laterality Date  . Cesarean section      x2  . Tubal ligation    . Eye surgery      lasik on both eyes  . Anterior cervical decomp/discectomy fusion N/A 12/03/2012    Procedure: CERVICAL SIX-SEVEN ANTERIOR CERVICAL DECOMPRESSION/DISKECTOMY FUSION;  Surgeon: Karn Cassis, MD;  Location: [facility];  Service: Neurosurgery;  Laterality: N/A;   Family History  Problem Relation Age of Onset  . Supraventricular  tachycardia Mother   . Hypertension Father   . Diabetes Father    History  Substance Use Topics  . Smoking status: Never Smoker   . Smokeless tobacco: Never Used  . Alcohol Use: Yes     Comment: socially   OB History   Grav Para Term Preterm Abortions TAB SAB Ect Mult Living                 Review of Systems  Respiratory: Negative for shortness of breath.   Cardiovascular: Positive for chest pain.  Gastrointestinal: Negative for nausea and vomiting.  Psychiatric/Behavioral: Positive for dysphoric mood. Negative for suicidal ideas. The patient is nervous/anxious.   All other systems reviewed and are negative.   Allergies  Avelox  Home Medications   Prior to Admission medications   Medication Sig Start Date End Date Taking? Authorizing Provider  diazepam (VALIUM) 5 MG tablet Take 5 mg by mouth every 6 (six) hours as needed (muscle spasms).    Historical Provider, MD  escitalopram (LEXAPRO) 20 MG tablet Take 20 mg by mouth daily.    Historical Provider, MD  metoprolol succinate (TOPROL-XL) 25 MG 24 hr tablet Take 25 mg by mouth daily.    Historical Provider, MD  oxyCODONE (ROXICODONE) 15 MG immediate release tablet Take 15 mg by mouth every 4 (four) hours  as needed for pain.    Historical Provider, MD   BP 143/85  Pulse 71  Temp(Src) 98.2 F (36.8 C) (Oral)  Resp 16  SpO2 100% Physical Exam  Nursing note and vitals reviewed. Constitutional: She is oriented to person, place, and time. She appears well-developed and well-nourished. She does not appear ill. No distress.  HENT:  Head: Normocephalic and atraumatic.  Right Ear: External ear normal.  Left Ear: External ear normal.  Nose: Nose normal.  Mouth/Throat: Oropharynx is clear and moist.  Eyes: Conjunctivae are normal.  Neck: Normal range of motion.  Cardiovascular: Normal rate, regular rhythm and normal heart sounds.   Pulmonary/Chest: Effort normal and breath sounds normal. No stridor. No respiratory distress.  She has no wheezes. She has no rales.  Abdominal: Soft. She exhibits no distension.  Musculoskeletal: Normal range of motion.  Neurological: She is alert and oriented to person, place, and time. She has normal strength.  Skin: Skin is warm and dry. She is not diaphoretic. No erythema.  Psychiatric: Her behavior is normal. She exhibits a depressed mood. She expresses no homicidal and no suicidal ideation. She expresses no suicidal plans and no homicidal plans.    ED Course  Procedures (including critical care time) DIAGNOSTIC STUDIES: Oxygen Saturation is 100% on Ra, normal by my interpretation.    COORDINATION OF CARE: 10:03 PM- Pt advised of plan for treatment and pt agrees.  Labs Review Labs Reviewed  COMPREHENSIVE METABOLIC PANEL - Abnormal; Notable for the following:    Total Bilirubin <0.2 (*)    All other components within normal limits  SALICYLATE LEVEL - Abnormal; Notable for the following:    Salicylate Lvl <2.0 (*)    All other components within normal limits  URINE RAPID DRUG SCREEN (HOSP PERFORMED) - Abnormal; Notable for the following:    Benzodiazepines POSITIVE (*)    All other components within normal limits  ACETAMINOPHEN LEVEL  CBC  ETHANOL  POC URINE PREG, ED    Imaging Review No results found.   EKG Interpretation None      MDM   Final diagnoses:  Depression   Patient presents to ED for evaluation of depression. She has not taken any of her antidepressants. She has vague suicidal ideations. Will have psychiatry consult with patient. Vital signs stable. Patient medically cleared. Patient / Family / Caregiver informed of clinical course, understand medical decision-making process, and agree with plan.   I personally performed the services described in this documentation, which was scribed in my presence. The recorded information has been reviewed and is accurate.     Mora Bellman, PA-C 11/03/13 [phone]

## 2013-11-02 NOTE — ED Notes (Addendum)
Pt reports severe depression, anger issues, and anxiety.  Pt reports being seen at [facility] and  But states that her meds needs to be regulated.  Pt reports increasing depression.  Pt denies SI/HI but states she did not care if she took an overdose.

## 2013-11-02 NOTE — ED Notes (Signed)
Bed: WBH42 Expected date:  Expected time:  Means of arrival:  Comments: Hold for triage 4 

## 2013-11-03 MED ORDER — HALOPERIDOL 5 MG PO TABS
5.0000 mg | ORAL_TABLET | Freq: Three times a day (TID) | ORAL | Status: DC | PRN
  Administered 2013-11-03: 5 mg via ORAL
  Filled 2013-11-03: qty 1

## 2013-11-03 MED ORDER — LORAZEPAM 1 MG PO TABS
2.0000 mg | ORAL_TABLET | Freq: Four times a day (QID) | ORAL | Status: DC | PRN
  Administered 2013-11-03: 2 mg via ORAL
  Filled 2013-11-03: qty 2

## 2013-11-03 NOTE — Discharge Instructions (Signed)

## 2013-11-03 NOTE — ED Notes (Signed)
Patient complains anxiety. Patient appears sad and is tearful at this time. Patient states that her [facility] Patient will be medicated per MAR. Encouragement and support provided and safety maintain.

## 2013-11-03 NOTE — ED Provider Notes (Signed)
Medical screening examination/treatment/procedure(s) were performed by non-physician practitioner and as supervising physician I was immediately available for consultation/collaboration.   EKG Interpretation None      Patient was seen and evaluated by psychiatry.  They do not believe she is a threat to herself or to others.  Outpatient resources have been given.  Patient is agreeable to outpatient plan per TTS team  Lyanne CoKevin M Keshun Berrett, MD 11/03/13 [phone]

## 2013-11-03 NOTE — ED Notes (Signed)
Patient complains of increase anxiety. Patient states " I feel like I'm going to jump out of my skin". Donell SievertSpencer Simon, PA contacted and new orders received and and read back. Encouragement and support provided and safety maintain.

## 2015-10-23 IMAGING — CR DG CHEST 2V
2 series · 2 of 2 positions shown · non-contrast
Comparison: 11/15/2012 right shoulder series

CLINICAL DATA: Hypertension, preoperative evaluation

EXAM:
CHEST  2 VIEW

[w chest pa]
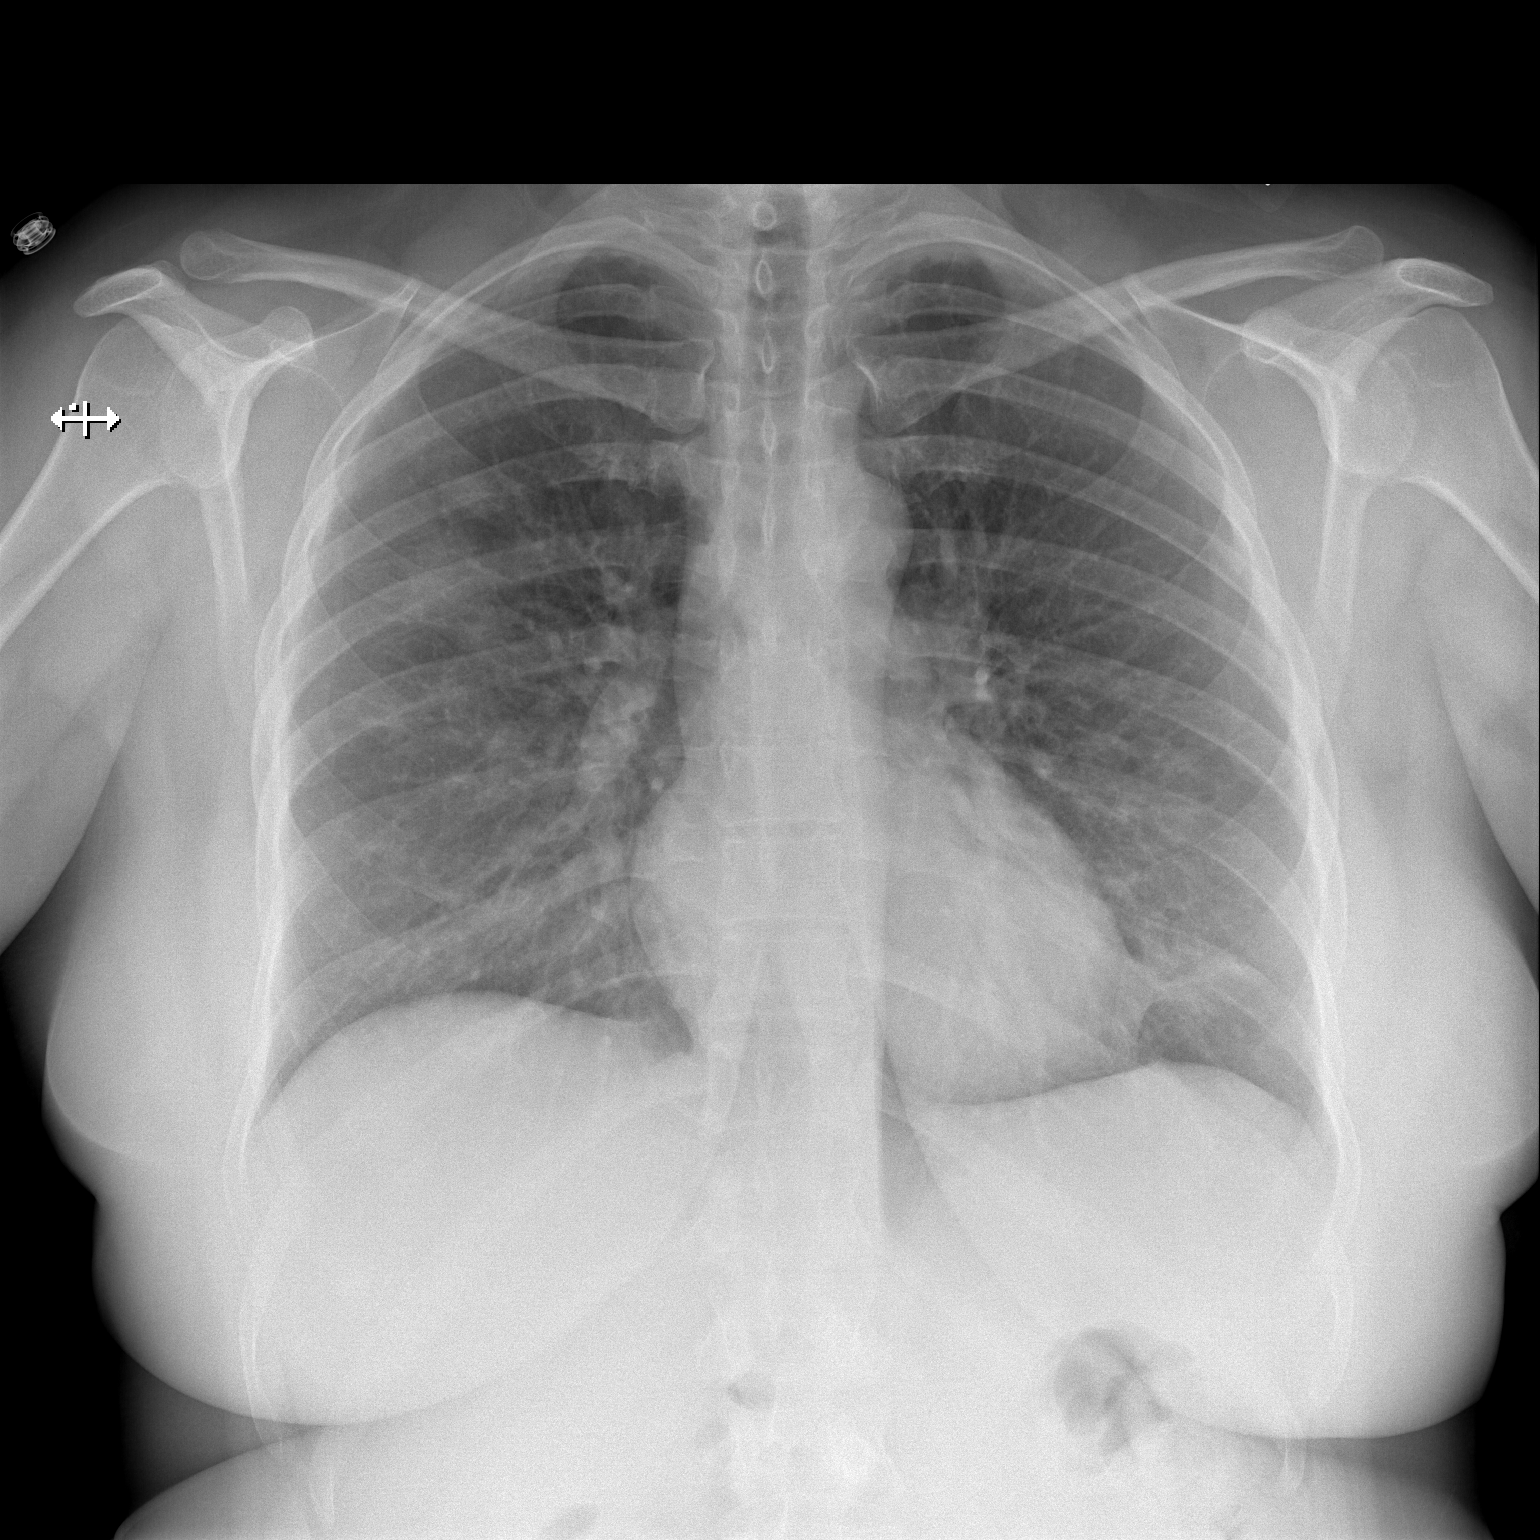

[w chest lat]
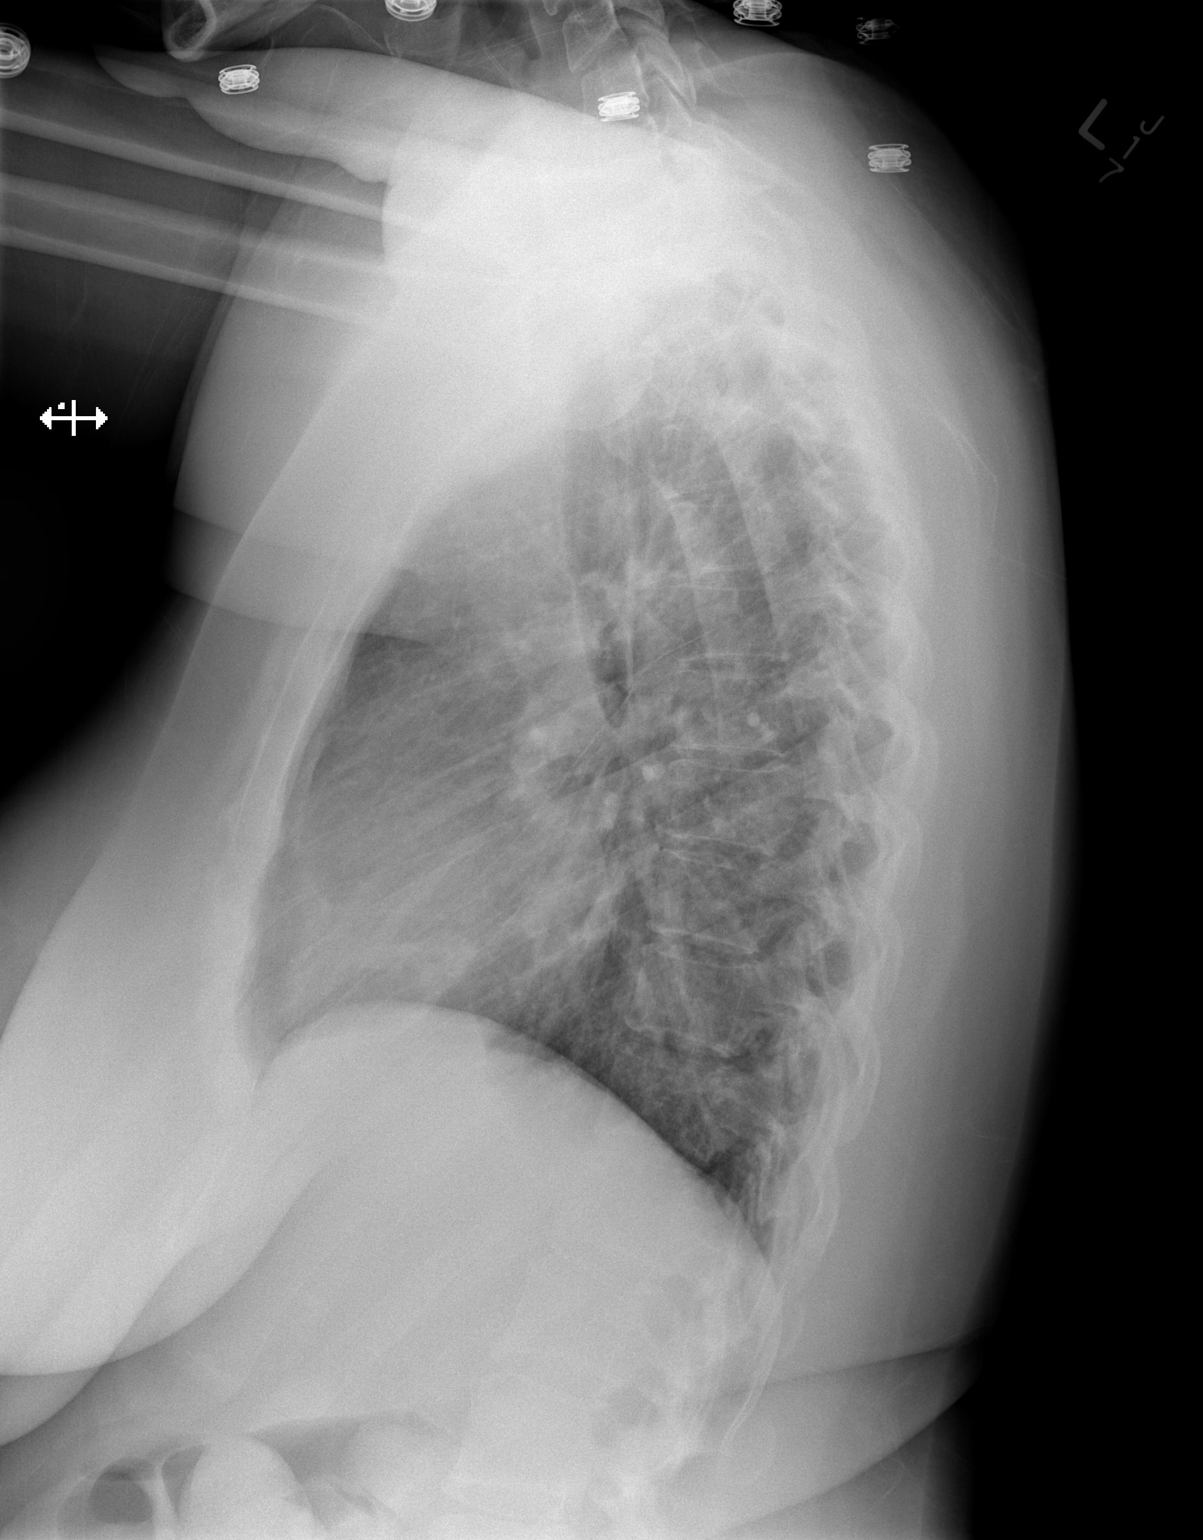

[2 of 2 positions shown; findings below may reference images not displayed]

FINDINGS: Normal heart size and vascularity. Faint ill-defined round opacity
in the right upper lung, roughly measures 2.5 x 2.0 cm, and projects
over the 3rd anterior rib and the 6th posterior rib shadows. Focal
airspace process/pneumonia versus underlying nodule not excluded.
Minor central bronchitic change and left basilar streaky opacity
compatible with atelectasis. No effusion or pneumothorax. Trachea is
midline. No osseous abnormality.
IMPRESSION: Right upper lung 2.5 cm round focal airspace process/pneumonia
versus lung nodule. Consider short-term followup in 1 month versus
nonemergent chest CT.

Mild central bronchitic change and left base atelectasis

These results will be called to the ordering clinician or
representative by the Radiologist Assistant, and communication
documented in the PACS Dashboard.

## 2015-10-23 IMAGING — CT CT CHEST W/ CM
2 of 3 series · 15 of 36 positions shown, 18 images · IV contrast (omnipaque)
Comparison: Chest x-ray from earlier the same

CLINICAL DATA: Mass in the right lung

EXAM:
CT CHEST WITH CONTRAST
TECHNIQUE: Multidetector CT imaging of the chest was performed during
intravenous contrast administration.
CONTRAST:  75mL OMNIPAQUE IOHEXOL 300 MG/ML  SOLN

[Series 2: thorax 5.0 i31f 1 · axial · 0.74mm/px · z∈[+1093,+1333]mm · 12 of 58 slices shown, 15 images]
[im 5/58  mediastinal]
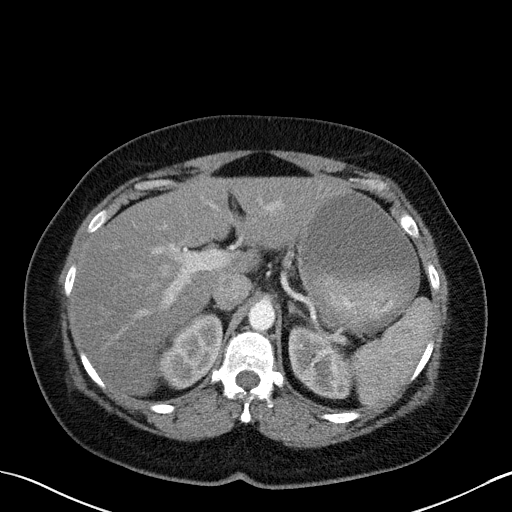
[im 5/58  lung]
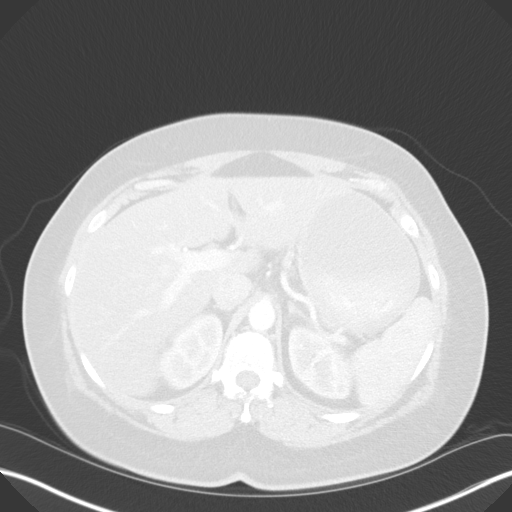
[im 9/58  lung]
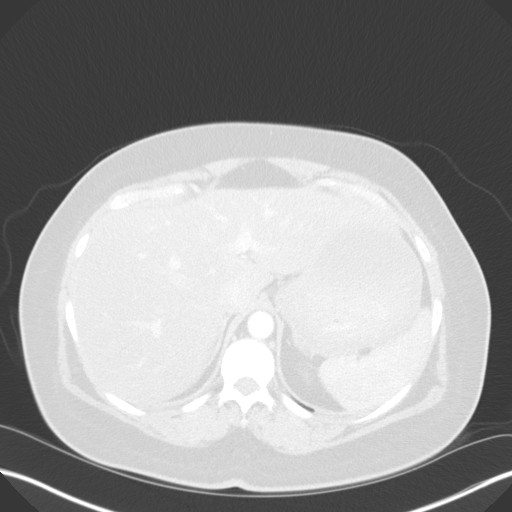
[im 13/58  lung]
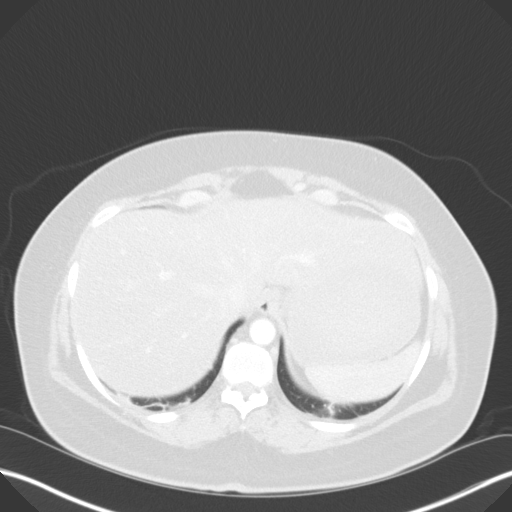
[im 17/58  lung]
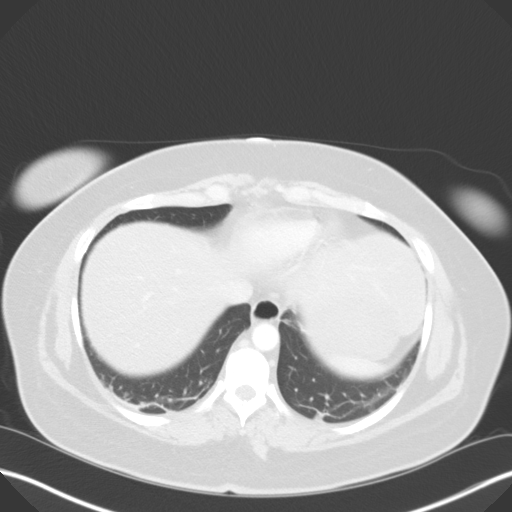
[im 22/58  mediastinal]
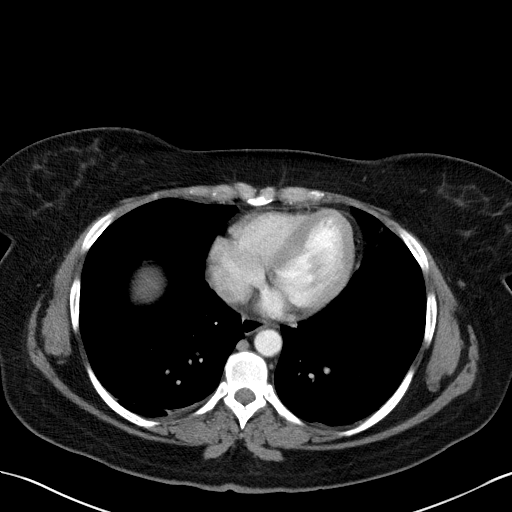
[im 22/58  lung]
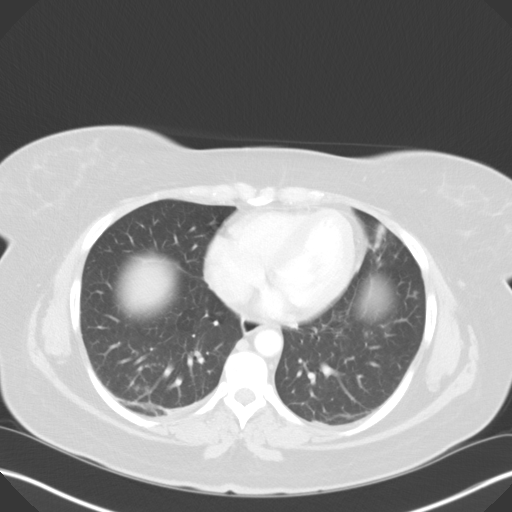
[im 26/58  lung]
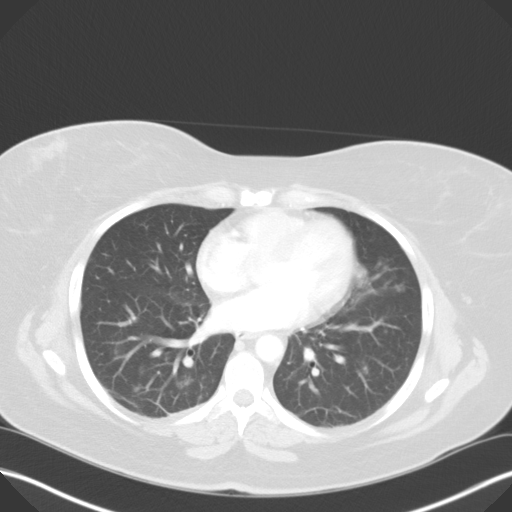
[im 32/58  lung]
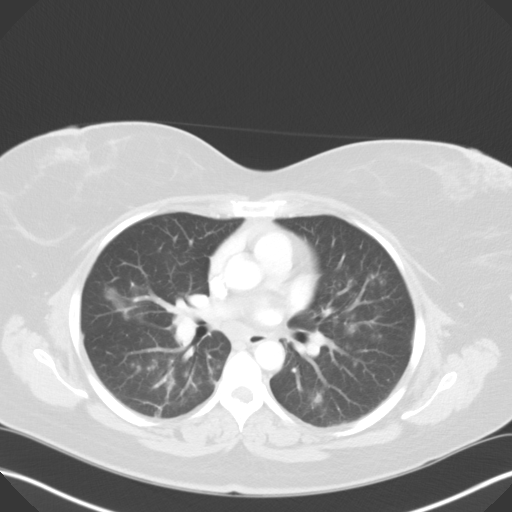
[im 36/58  lung]
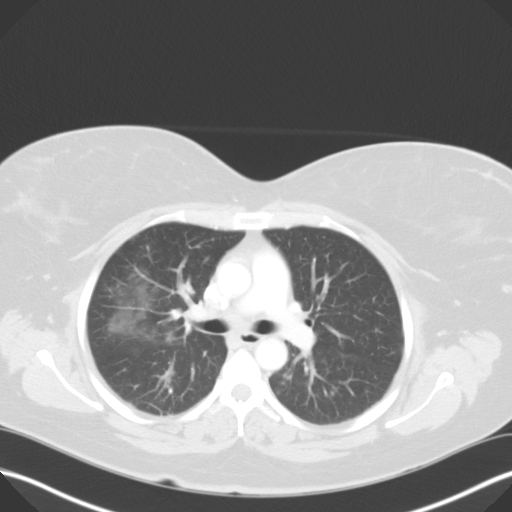
[im 41/58  mediastinal]
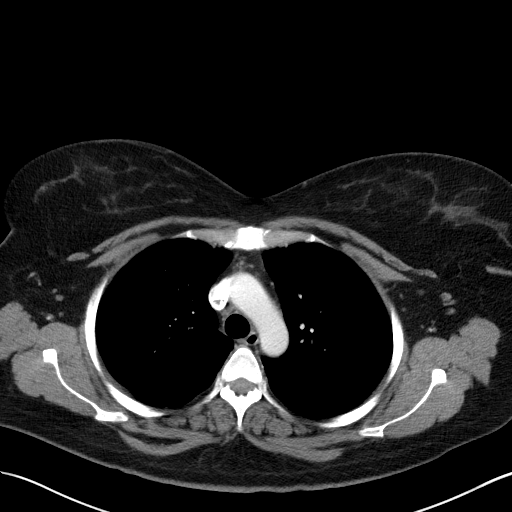
[im 41/58  lung]
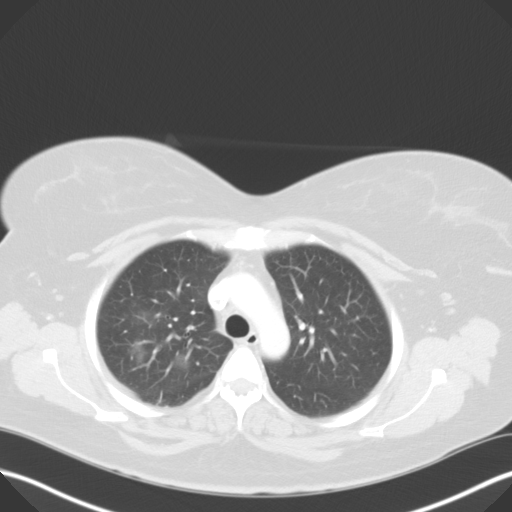
[im 45/58  lung]
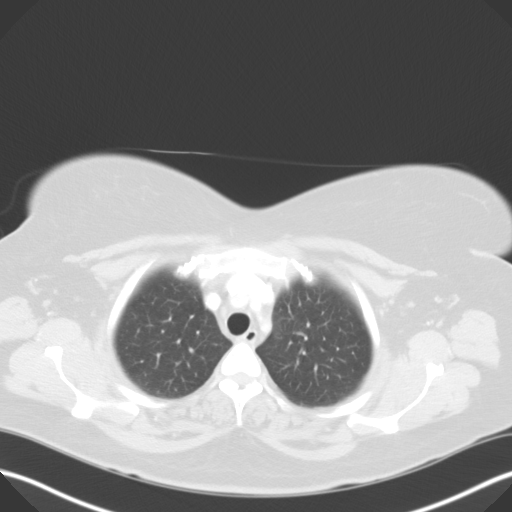
[im 49/58  lung]
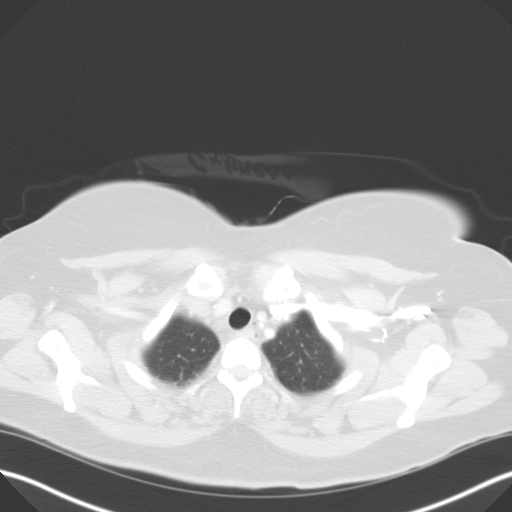
[im 53/58  lung]
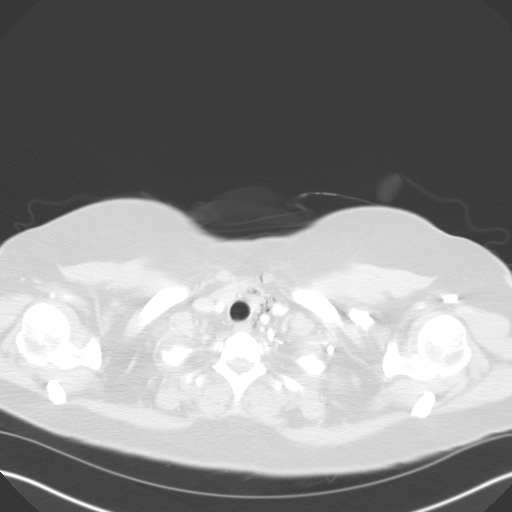

[Series 5: coronal · coronal · 0.59mm/px · 3 of 74 slices shown]
[im 15/74  lung]
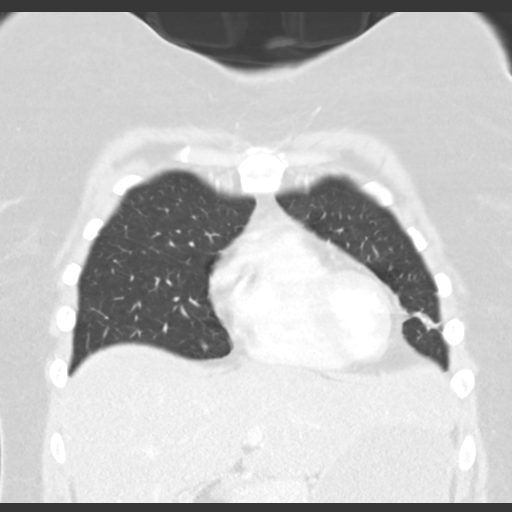
[im 30/74  lung]
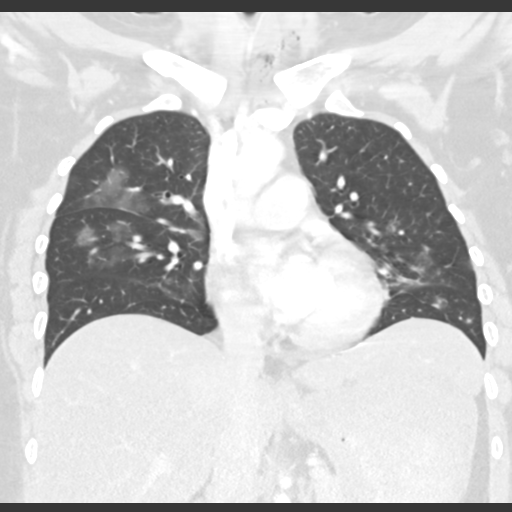
[im 44/74  lung]
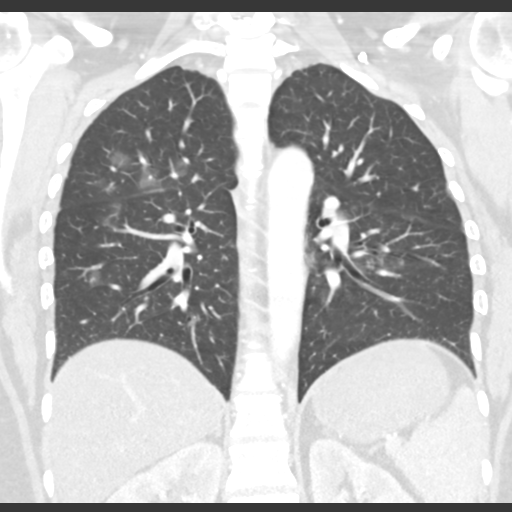

[15 of 36 positions shown; findings below may reference images not displayed]

FINDINGS: THORACIC INLET/BODY WALL:

There is gas and edema in the left lower neck in this with recent
anterior cervical discectomy and fusion.

MEDIASTINUM:

Normal heart size. No pericardial effusion. No acute vascular
abnormality. No adenopathy.

LUNG WINDOWS:

There is diffuse ground-glass and centrilobular and nodular
opacities throughout the bilateral lungs. Dependent lower lobes
shows typical bandlike atelectasis. No rounded mass concerning for
neoplasm.

UPPER ABDOMEN:

Fatty liver.

OSSEOUS:

No acute fracture.  No suspicious lytic or blastic lesions.
IMPRESSION: Bilateral airspace disease which suggests an infectious or
inflammatory pneumonia. If there was perioperative aspiration, this
could contribute to the chest CT findings, although lung opacity was
present on the preoperative radiograph. Recommend followup chest
radiography to ensure clearing.

## 2022-09-21 ENCOUNTER — Telehealth: Payer: Self-pay

## 2022-09-21 NOTE — Telephone Encounter (Signed)
Tc on 09-21-22 @ 11:19. Pt was left a message that per dr. Elna Breslow she would need to review her previous psychiatric records before determining what level of care she may need.  Also will not be prescribing xanax long term, will be tapered off.

## 2022-09-21 NOTE — Telephone Encounter (Signed)
-----   Message from Jomarie Longs sent at 09/21/2022  8:33 AM EDT ----- Regarding: RE: review referral I need to review her previous psychiatric records before determining what level of care she may need. Also will not be prescribing xanax long term, will be tapered off.  Please get records.  Thank you ----- Message ----- From: Elvina Mattes, CMA Sent: 09/20/2022  11:27 AM EDT To: Jomarie Longs, MD Subject: review referral                                Please review referral

## 2023-05-22 ENCOUNTER — Other Ambulatory Visit: Payer: Self-pay | Admitting: Physician Assistant

## 2023-05-22 DIAGNOSIS — M5416 Radiculopathy, lumbar region: Secondary | ICD-10-CM

## 2023-06-19 NOTE — Discharge Instructions (Signed)

## 2023-06-20 ENCOUNTER — Ambulatory Visit
Admission: RE | Admit: 2023-06-20 | Discharge: 2023-06-20 | Disposition: A | Discharge status: Home / Self Care | Payer: MEDICAID | Source: Ambulatory Visit | Attending: Physician Assistant | Admitting: Physician Assistant

## 2023-06-20 DIAGNOSIS — M5416 Radiculopathy, lumbar region: Secondary | ICD-10-CM

## 2023-06-20 MED ORDER — IOPAMIDOL (ISOVUE-M 200) INJECTION 41%
1.0000 mL | Freq: Once | INTRAMUSCULAR | Status: AC
  Administered 2023-06-20: 1 mL via EPIDURAL

## 2023-06-24 ENCOUNTER — Other Ambulatory Visit: Payer: Self-pay | Admitting: Physician Assistant

## 2023-06-24 DIAGNOSIS — M5416 Radiculopathy, lumbar region: Secondary | ICD-10-CM
